# Patient Record
Sex: Male | Born: 1995 | Race: White | Hispanic: No | Marital: Single | Smoking: Never smoker
Health system: Southern US, Community
[De-identification: ages and names within clinical notes are randomized; demographics above are authoritative.]

## PROBLEM LIST (undated history)

## (undated) DIAGNOSIS — E079 Disorder of thyroid, unspecified: Secondary | ICD-10-CM

## (undated) HISTORY — PX: TONSILLECTOMY: SUR1361

## (undated) HISTORY — PX: APPENDECTOMY: SHX54

---
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
## 2017-08-15 ENCOUNTER — Encounter: Payer: Self-pay | Admitting: General Surgery

## 2017-08-16 ENCOUNTER — Encounter: Payer: Self-pay | Admitting: General Surgery

## 2023-07-05 ENCOUNTER — Encounter (HOSPITAL_BASED_OUTPATIENT_CLINIC_OR_DEPARTMENT_OTHER): Payer: Self-pay | Admitting: Emergency Medicine

## 2023-07-05 ENCOUNTER — Ambulatory Visit (HOSPITAL_BASED_OUTPATIENT_CLINIC_OR_DEPARTMENT_OTHER)
Admission: EM | Admit: 2023-07-05 | Discharge: 2023-07-05 | Disposition: A | Discharge status: Home / Self Care | Payer: Commercial Managed Care - HMO | Attending: Family Medicine | Admitting: Family Medicine

## 2023-07-05 DIAGNOSIS — J069 Acute upper respiratory infection, unspecified: Secondary | ICD-10-CM | POA: Diagnosis not present

## 2023-07-05 HISTORY — DX: Disorder of thyroid, unspecified: E07.9

## 2023-07-05 MED ORDER — PROMETHAZINE-DM 6.25-15 MG/5ML PO SYRP: 5.0000 mL | ORAL_SOLUTION | Freq: Four times a day (QID) | ORAL | 0 refills | Status: AC | PRN

## 2023-07-05 NOTE — ED Triage Notes (Signed)
 Pt reports since Wed had cough and congestion. Took OTC meds for symptoms but none today. Also had headaches. Denies pain at this time.

## 2023-07-05 NOTE — ED Provider Notes (Signed)
  [facility]   [id number] 07/05/23 Arrival Time: 1120  ASSESSMENT & PLAN:  1. Viral URI with cough    Discussed typical duration of likely viral illness. OTC symptom care as needed.  Meds ordered this encounter  Medications   promethazine-dextromethorphan (PROMETHAZINE-DM) 6.25-15 MG/5ML syrup    Sig: Take 5 mLs by mouth 4 (four) times daily as needed for cough.    Dispense:  118 mL    Refill:  0     Follow-up Information     [facility], [facility]   Specialty: Family Medicine Why: As needed. Contact information: [address] [place] [address] [phone]                 Reviewed expectations re: course of current medical issues. Questions answered. Outlined signs and symptoms indicating need for more acute intervention. Understanding verbalized. After Visit Summary given.   SUBJECTIVE: History from: Patient. Levi Carpenter is a 28 y.o. male. Pt reports since Wed had cough and congestion. Took OTC meds for symptoms but none today. Also had headaches. Denies pain at this time.  Denies: difficulty breathing. Normal PO intake without n/v/d.  OBJECTIVE:  Vitals:   07/05/23 1141  BP: 118/76  Pulse: 71  Resp: 17  Temp: 99.1 F (37.3 C)  TempSrc: Oral  SpO2: 97%    General appearance: alert; no distress Eyes: PERRLA; EOMI; conjunctiva normal HENT: [place]; AT; with nasal congestion Neck: supple  Lungs: speaks full sentences without difficulty; unlabored; dry cough Extremities: no edema Skin: warm and dry Neurologic: normal gait Psychological: alert and cooperative; normal mood and affect  Labs: No results found for this or any previous visit. Labs Reviewed - No data to display  Imaging: No results found.  No Known Allergies  Past Medical History:  Diagnosis Date   Thyroid disease    Social History   Socioeconomic History   Marital status: Single    Spouse name: Not on file   Number of children: Not on file    Years of education: Not on file   Highest education level: Not on file  Occupational History   Not on file  Tobacco Use   Smoking status: Never   Smokeless tobacco: Not on file  Substance and Sexual Activity   Alcohol use: No   Drug use: Not on file   Sexual activity: Not on file  Other Topics Concern   Not on file  Social History Narrative   Not on file   Social Drivers of Health   Financial Resource Strain: Not on file  Food Insecurity: Not on file  Transportation Needs: Not on file  Physical Activity: Not on file  Stress: Not on file  Social Connections: Not on file  Intimate Partner Violence: Not on file   No family history on file. Past Surgical History:  Procedure Laterality Date   APPENDECTOMY     TONSILLECTOMY       Mardella Layman, MD 07/05/23 1430
